# Patient Record
Sex: Male | Born: 1990 | Race: Black or African American | Hispanic: No | Marital: Single | Smoking: Current every day smoker
Health system: Southern US, Community
[De-identification: ages and names within clinical notes are randomized; demographics above are authoritative.]

## PROBLEM LIST (undated history)

## (undated) DIAGNOSIS — J45909 Unspecified asthma, uncomplicated: Secondary | ICD-10-CM

---

## 2014-05-15 ENCOUNTER — Emergency Department (HOSPITAL_COMMUNITY)
Admission: EM | Admit: 2014-05-15 | Discharge: 2014-05-15 | Disposition: A | Discharge status: Home / Self Care | Payer: Self-pay | Attending: Emergency Medicine | Admitting: Emergency Medicine

## 2014-05-15 ENCOUNTER — Encounter (HOSPITAL_COMMUNITY): Payer: Self-pay | Admitting: Emergency Medicine

## 2014-05-15 ENCOUNTER — Emergency Department (HOSPITAL_COMMUNITY): Payer: Self-pay

## 2014-05-15 DIAGNOSIS — Z720 Tobacco use: Secondary | ICD-10-CM | POA: Insufficient documentation

## 2014-05-15 DIAGNOSIS — R05 Cough: Secondary | ICD-10-CM

## 2014-05-15 DIAGNOSIS — B349 Viral infection, unspecified: Secondary | ICD-10-CM | POA: Insufficient documentation

## 2014-05-15 DIAGNOSIS — R059 Cough, unspecified: Secondary | ICD-10-CM

## 2014-05-15 DIAGNOSIS — J40 Bronchitis, not specified as acute or chronic: Secondary | ICD-10-CM | POA: Insufficient documentation

## 2014-05-15 MED ORDER — ALBUTEROL SULFATE HFA 108 (90 BASE) MCG/ACT IN AERS
1.0000 | INHALATION_SPRAY | Freq: Four times a day (QID) | RESPIRATORY_TRACT | Status: DC | PRN
  Administered 2014-05-15: 1 via RESPIRATORY_TRACT
  Filled 2014-05-15: qty 6.7

## 2014-05-15 MED ORDER — ALBUTEROL SULFATE HFA 108 (90 BASE) MCG/ACT IN AERS: 2.0000 | INHALATION_SPRAY | Freq: Four times a day (QID) | RESPIRATORY_TRACT | Status: DC | PRN

## 2014-05-15 NOTE — ED Notes (Signed)
Pt c/o cough and nasal congestion x 3 days  °

## 2014-05-15 NOTE — ED Provider Notes (Signed)
CSN: [id number]     Arrival date & time 05/15/14  1405 History   First MD Initiated Contact with Patient 05/15/14 1551     Chief Complaint  Patient presents with  . Cough  . Nasal Congestion     (Consider location/radiation/quality/duration/timing/severity/associated sxs/prior Treatment) The history is provided by the patient. No language interpreter was used.  Christopher Gentry is a 24 year old male with no known significant past medical history presenting to the ED with chest congestion and cough that has been ongoing for approximately 2-3 days. Patient reports that he has been experiencing nasal congestion, productive cough and chest congestion reported that when he coughs she is coughing up a thick white yellow phlegm experiences burning after coughing. Stated that he coughed up specks of bright red blood mixed with the phlegm. Stated that he has history of bronchitis and normally gets bright red blood specks in his phlegm when he coughs them up. Stated he's been using Delsym and Mucinex without relief. Reported that patient's girlfriend had similar symptoms. Reports she smokes cigarettes at least 5 cigarettes per day as well as marijuana. Denied chest pain, shortness of breath, difficulty breathing, blurred vision, sudden loss of vision, neck pain, neck stiffness, nausea, vomiting, abdominal pain, headache, dizziness, ear pain, sore throat, eye pain, travel, leg swelling. PCP none  History reviewed. No pertinent past medical history. History reviewed. No pertinent past surgical history. History reviewed. No pertinent family history. History  Substance Use Topics  . Smoking status: Current Every Day Smoker  . Smokeless tobacco: Not on file  . Alcohol Use: Yes    Review of Systems  Constitutional: Negative for fever and chills.  HENT: Positive for congestion. Negative for sore throat and trouble swallowing.   Eyes: Negative for visual disturbance.  Respiratory: Positive for cough. Negative  for chest tightness and shortness of breath.   Cardiovascular: Negative for chest pain.  Gastrointestinal: Negative for nausea, vomiting and abdominal pain.  Musculoskeletal: Negative for neck pain and neck stiffness.  Neurological: Negative for dizziness, weakness and headaches.      Allergies  Review of patient's allergies indicates no known allergies.  Home Medications   Prior to Admission medications   Not on File   BP 131/81 mmHg  Pulse 73  Temp(Src) 98 F (36.7 C) (Oral)  Resp 18  SpO2 100% Physical Exam  Constitutional: He is oriented to person, place, and time. He appears well-developed and well-nourished. No distress.  HENT:  Head: Normocephalic and atraumatic.  Right Ear: Hearing, tympanic membrane, external ear and ear canal normal. No mastoid tenderness.  Left Ear: Hearing, tympanic membrane, external ear and ear canal normal. No mastoid tenderness.  Mouth/Throat: Oropharynx is clear and moist. No oropharyngeal exudate.  Eyes: Conjunctivae and EOM are normal. Pupils are equal, round, and reactive to light. Right eye exhibits no discharge. Left eye exhibits no discharge.  Neck: Normal range of motion. Neck supple. No tracheal deviation present.  Negative neck stiffness Negative nuchal rigidity Negative cervical lymphadenopathy Negative meningeal signs  Cardiovascular: Normal rate, regular rhythm and normal heart sounds.  Exam reveals no friction rub.   No murmur heard. Pulmonary/Chest: Effort normal and breath sounds normal. No respiratory distress. He has no wheezes. He has no rales.  Patient is able to speak in full sentences without difficulty Negative use of accessory muscles Negative stridor  Musculoskeletal: Normal range of motion.  Lymphadenopathy:    He has no cervical adenopathy.  Neurological: He is alert and oriented to person, place, and  time. No cranial nerve deficit. He exhibits normal muscle tone. Coordination normal.  Skin: Skin is warm and  dry. No rash noted. He is not diaphoretic. No erythema.  Psychiatric: He has a normal mood and affect. His behavior is normal. Thought content normal.  Nursing note and vitals reviewed.   ED Course  Procedures (including critical care time) Labs Review Labs Reviewed - No data to display  Imaging Review Dg Chest 2 View  05/15/2014   CLINICAL DATA:  Pt c/o cough and congestion x 3 days. Unable to cough up phlegm. Hx of bronchitis  EXAM: CHEST  2 VIEW  COMPARISON:  None.  FINDINGS: The heart size and mediastinal contours are within normal limits. Both lungs are clear. No pleural effusion or pneumothorax. The visualized skeletal structures are unremarkable.  IMPRESSION: Normal chest radiograph.   Electronically Signed   By: Amie Portlandavid  Ormond M.D.   On: 05/15/2014 16:36     EKG Interpretation None      MDM   Final diagnoses:  Bronchitis  Viral syndrome  Cough    Medications  albuterol (PROVENTIL HFA;VENTOLIN HFA) 108 (90 BASE) MCG/ACT inhaler 1 puff (not administered)    Filed Vitals:   05/15/14 1425 05/15/14 1716  BP: 136/79 131/81  Pulse: 82 73  Temp: 98.9 F (37.2 C) 98 F (36.7 C)  TempSrc: Oral Oral  Resp: 18 18  SpO2: 96% 100%   Chest x-ray normal. Negative findings of pneumonia noted on chest x-ray. Doubt PE-negative chest pain, negative tachycardia, negative tachypnea, negative hypoxia noted on examination. PERC low. Suspicion to be bronchitis, patient's sister the same in the past. Patient stable, afebrile. Patient not septic appearing. Negative signs of respiratory distress. Discharged patient. Discharge patient with albuterol inhaler to be used as needed and see pack. Discussed with patient to rest and stay hydrated. Discussed with patient to avoid smoking cigarettes as well as smoking cessation. [facility] Discussed with patient to closely monitor symptoms and if symptoms are to worsen or change to report back to the ED - strict  return instructions given.  Patient agreed to plan of care, understood, all questions answered.   Raymon MuttonMarissa Taaliyah Delpriore, PA-C 05/15/14 1718  Samuel JesterKathleen McManus, DO 05/17/14 2149

## 2014-05-15 NOTE — Discharge Instructions (Signed)
Please call your doctor for a followup appointment within 24-48 hours. When you talk to your doctor please let them know that you were seen in the emergency department and have them acquire all of your records so that they can discuss the findings with you and formulate a treatment plan to fully care for your new and ongoing problems. Please follow-up with health and wellness Center Please rest and stay hydrated Please avoid any physical strenuous activity Please use albuterol for shortness of breath or wheezing every 6 hours as needed Please avoid smoking for smoking can lead to worsening of symptoms as well as can lead to lung cancer contact him be fatal later down the road Please continue to monitor symptoms closely and if symptoms are to worsen or change (fever greater than 101, chills, sweating, nausea, vomiting, chest pain, shortness of breathe, difficulty breathing, weakness, numbness, tingling, worsening or changes to pain pattern, coughing up nothing but blood, neck pain, neck stiffness, dizziness, headache, loss of sensation, visual changes) please report back to the Emergency Department immediately.   Acute Bronchitis Bronchitis is inflammation of the airways that extend from the windpipe into the lungs (bronchi). The inflammation often causes mucus to develop. This leads to a cough, which is the most common symptom of bronchitis.  In acute bronchitis, the condition usually develops suddenly and goes away over time, usually in a couple weeks. Smoking, allergies, and asthma can make bronchitis worse. Repeated episodes of bronchitis may cause further lung problems.  CAUSES Acute bronchitis is most often caused by the same virus that causes a cold. The virus can spread from person to person (contagious) through coughing, sneezing, and touching contaminated objects. SIGNS AND SYMPTOMS   Cough.   Fever.   Coughing up mucus.   Body aches.   Chest congestion.   Chills.    Shortness of breath.   Sore throat.  DIAGNOSIS  Acute bronchitis is usually diagnosed through a physical exam. Your health care provider will also ask you questions about your medical history. Tests, such as chest X-rays, are sometimes done to rule out other conditions.  TREATMENT  Acute bronchitis usually goes away in a couple weeks. Oftentimes, no medical treatment is necessary. Medicines are sometimes given for relief of fever or cough. Antibiotic medicines are usually not needed but may be prescribed in certain situations. In some cases, an inhaler may be recommended to help reduce shortness of breath and control the cough. A cool mist vaporizer may also be used to help thin bronchial secretions and make it easier to clear the chest.  HOME CARE INSTRUCTIONS  Get plenty of rest.   Drink enough fluids to keep your urine clear or pale yellow (unless you have a medical condition that requires fluid restriction). Increasing fluids may help thin your respiratory secretions (sputum) and reduce chest congestion, and it will prevent dehydration.   Take medicines only as directed by your health care provider.  If you were prescribed an antibiotic medicine, finish it all even if you start to feel better.  Avoid smoking and secondhand smoke. Exposure to cigarette smoke or irritating chemicals will make bronchitis worse. If you are a smoker, consider using nicotine gum or skin patches to help control withdrawal symptoms. Quitting smoking will help your lungs heal faster.   Reduce the chances of another bout of acute bronchitis by washing your hands frequently, avoiding people with cold symptoms, and trying not to touch your hands to your mouth, nose, or eyes.   Keep  all follow-up visits as directed by your health care provider.  SEEK MEDICAL CARE IF: Your symptoms do not improve after 1 week of treatment.  SEEK IMMEDIATE MEDICAL CARE IF:  You develop an increased fever or chills.    You have chest pain.   You have severe shortness of breath.  You have bloody sputum.   You develop dehydration.  You faint or repeatedly feel like you are going to pass out.  You develop repeated vomiting.  You develop a severe headache. MAKE SURE YOU:   Understand these instructions.  Will watch your condition.  Will get help right away if you are not doing well or get worse. Document Released: 02/12/2004 Document Revised: 05/21/2013 Document Reviewed: 06/27/2012 [facility] Patient Information 2015 [place], [place]. This information is not intended to replace advice given to you by your health care provider. Make sure you discuss any questions you have with your health care provider.   Emergency Department Resource Guide 1) Find a Doctor and Pay Out of Pocket Although you won't have to find out who is covered by your insurance plan, it is a good idea to ask around and get recommendations. You will then need to call the office and see if the doctor you have chosen will accept you as a new patient and what types of options they offer for patients who are self-pay. Some doctors offer discounts or will set up payment plans for their patients who do not have insurance, but you will need to ask so you aren't surprised when you get to your appointment.  2) Contact Your Local Health Department Not all health departments have doctors that can see patients for sick visits, but many do, so it is worth a call to see if yours does. If you don't know where your local health department is, you can check in your phone book. The CDC also has a tool to help you locate your state's health department, and many state websites also have listings of all of their local health departments.  3) Find a Walk-in Clinic If your illness is not likely to be very severe or complicated, you may want to try a walk in clinic. These are popping up all over the country in pharmacies, drugstores, and shopping centers.  They're usually staffed by nurse practitioners or physician assistants that have been trained to treat common illnesses and complaints. They're usually fairly quick and inexpensive. However, if you have serious medical issues or chronic medical problems, these are probably not your best option.  No Primary Care Doctor: - Call Health Connect at  [phone] - they can help you locate a primary care doctor that  accepts your insurance, provides certain services, etc. - Physician Referral Service- [phone]  Chronic Pain Problems: Organization         Address  Phone   Notes  [facility]  [phone] Patients need to be referred by their primary care doctor.   Medication Assistance: Organization         Address  Phone   Notes  [facility] [address] [place], [place] [address] [phone] --Must be a resident of [facility] -- Must have NO insurance coverage whatsoever (no Medicaid/ Medicare, etc.) -- The pt. MUST have a primary care doctor that directs their care regularly and follows them in the community   [facility]  [phone]   [employer]  [phone]    Agencies that provide  inexpensive medical care: Organization         Address  Phone   Notes  [facility]  [phone]   [facility]    [phone]   [facility] [address] [place], [place] [id number] [phone]) [phone]   [facility] [place] [address], [place] [phone]) [phone]   Planned Parenthood    [phone]   [facility]    [phone]   [facility]  [address], [place] Phone:  [phone], Fax:  [phone]) [phone] Hours of Operation:  9 am - 6 pm, M-F.  Also accepts Medicaid/Medicare and self-pay.  [facility]  [address], [place] Phone: [phone], Fax: [phone])  [phone]. Hours of Operation:  8:30 am - 5:30 pm, M-F.  Also accepts Medicaid and self-pay.  [facility] [place] [address], [place] Phone: [phone]) [phone]   [facility] [address], [place], [place] [phone], Ext. [phone] Mondays & Thursdays: 7-9 AM.  First 15 patients are seen on a first come, first serve basis.    Medicaid-accepting [facility] Providers:  Organization         Address  Phone   Notes  [facility] [address], [place] [phone] Also accepts self-pay patients.  [facility] [address], [place]  [phone]) [phone]   [facility] [address], [place] [phone]) [phone]   [facility] [address] [place] [address], [place] [phone]) [phone]   Renaye RakersVeita Bland [address], [place]   [phone] Only accepts [place] Access [place] patients after they have their name applied to their card.   Self-Pay (no insurance) in [facility]:  Organization         Address  Phone   Notes  Sickle Cell Patients, [facility] [address], [place] [phone]) [phone]   [facility] [address], [place] [phone]) [phone]   [facility] [place]  [address], [place] [phone]   [facility]/Dr. Osei-Bonsu  [address] [place] [address], [place] or [phone], [place] [phone] Phone number for both [place] and [place] locations is the same.  [facility] [address], [place] [phone]   [facility] [place] [address] [place] [address], [place] or [address] [phone]   [facility] [address], [place] [phone](336) [phone], phone; [phone]) [phone], fax Sees patients 1st and 3rd Saturday of every month.  Must not qualify for public or private insurance (i.e.  Medicaid, Medicare, [place] Health Choice, Veterans' Benefits)  Household income should be no more than 200% of the poverty level The clinic cannot treat you if you are pregnant or think you are pregnant  Sexually transmitted diseases are not treated at the clinic.    Dental Care: Organization         Address  Phone  Notes  [facility] of [facility] [address], [place] [phone]) [phone] Accepts children up to age 24 who are enrolled in [place] or [place] Health Choice; pregnant women with a Medicaid card; and children who have applied for Medicaid or [place] Health Choice,  but were declined, whose parents can pay a reduced fee at time of service.  [facility] of [facility]  [address], [place] [phone] Accepts children up to age 5 who are enrolled in [place] or [place] Health Choice; pregnant women with a Medicaid card; and children who have applied for Medicaid or [place] Health Choice, but were declined, whose parents can pay a reduced fee at time of service.  [facility]  [address] [place], [place] [phone] Patients are seen by appointment only. Walk-ins are not accepted. [facility] will see patients 19 years of age and older. Monday - Tuesday (8am-5pm) Most Wednesdays (8:30-5pm) $30 per visit, cash only  [facility] Adult Dental Access PROGRAM  [address], [facility] [phone] Patients are seen by appointment only. Walk-ins are not accepted. [facility] will see patients 49 years of age and older. One Wednesday Evening (Monthly: Volunteer Based).  $30 per visit, cash only  [employer]  [phone] for adults; Children under age 71, call Graduate Pediatric Dentistry at [phone]. Children aged 39-14, please call [phone] to request a pediatric application.  Dental services are provided in all areas of dental care including fillings,  crowns and bridges, complete and partial dentures, implants, gum treatment, root canals, and extractions. Preventive care is also provided. Treatment is provided to both adults and children. Patients are selected via a lottery and there is often a waiting list.   [facility] [address] [place]  [phone] [email or web address]   [facility] [address] [place], [place] [phone], Ext. [phone] Second and Fourth Thursday of each month, opens at 6:30 AM; Clinic ends at 9 AM.  Patients are seen on a first-come first-served basis, and a limited number are seen during each clinic.   [facility]  [address] [place], [place] [phone]   Eligibility Requirements You must have lived in [place], [place], or [place] counties for at least the last three months.   You cannot be eligible for state or federal sponsored [place], including [employer], [place], or [employer].   You generally cannot be eligible for healthcare insurance through your employer.    How to apply: Eligibility screenings are held every Tuesday and Wednesday afternoon from 1:00 pm until 4:00 pm. You do not need an appointment for the interview!  [facility] [address], [place], [place] [phone]   [facility] Health Department  [phone]   [facility]  [phone]   [facility]  [phone]    Behavioral Health Resources in the Community: Intensive Outpatient Programs Organization         Address  Phone  Notes  [facility] [address], [place], [place] [phone]   [facility] Outpatient [address] [place], [place] [phone]   [facility] [address] [place], [place]  [phone]   [facility] [address],  [place], [place] [phone] or [phone]   Substance Abuse  Resources Organization         Address  Phone  Notes  [facility] Drug Services  [phone]   [facility]  4348559662   [facility] [place]  [phone]   Floydene Flock  [phone]   Residential & Outpatient Substance Abuse Program  [phone]   Psychological  Passenger transport manager         Address  Phone  Notes  [employer] [facility]  [phone]   [place] [facility]  [phone]   [facility] [phone] N[address] [place] [phone] or [phone]    Mobile Crisis Teams Organization         Address  Phone  Notes  [facility], Mobile Crisis Care Unit  [phone]   [facility]  [address] [place], [place] [phone]   Doristine Locks [address] [place] [phone]    Self-Help/Support Groups Organization         Address  Phone             Notes  [facility] [place] - variety of support groups  336- I7437963 Call for more information  Narcotics Anonymous (NA), [facility] [address], [employer] [place]  2 meetings at this location   Statistician         Address  Phone  Notes  [facility] [address],    [place]  [phone]   [facility]  [address] [place] [address], [place], [place] [phone]   [facility] [address] [place], [place] [phone] Admissions: 8am-3pm M-F  [facility] [address]    [place], [place] [phone]   [facility] [address] [place], [place], [place] [phone]   [facility] [address]  [place], [place] [phone]   [facility] - Intensive Outpatient [address], [place], [place] [phone]   [facility] ([facility] [address]  [place], [place] [phone] or [phone]   [facility] ([facility]) [address] [place], [place] [phone] Accepts Medicaid  [facility] [address],  [place] [phone] Substance Abuse/Addiction Treatment   [facility] Organization         Address  Phone  Notes  [facility]  [phone]   Angie Fava, PhD [address] [place], [place]   [phone] or [phone]   [facility]   [address] [place], [place] [phone]   [facility] [address] [place], [place] [phone] Insurance/Medicaid/sponsorship through [facility] and Families [address]                                    [place], [place] [phone] Therapy/tele-psych/case  [facility] [address], [place] [phone]    Dr. Lolly Mustache  [phone]   [facility]  [employer] [facility] Dept. 1) [address]  2) [address] [place] 3)  [address], [place] [phone]  [phone]   [facility] Child Abuse Hotline 713-483-8211 or [phone] (After Hours)

## 2014-05-15 NOTE — ED Notes (Signed)
Pt c/o cough and congestion x 3 days. Unable to cough up phlegm. Hx of bronchitis

## 2014-06-05 ENCOUNTER — Emergency Department (HOSPITAL_COMMUNITY): Payer: Self-pay

## 2014-06-05 ENCOUNTER — Emergency Department (HOSPITAL_COMMUNITY)
Admission: EM | Admit: 2014-06-05 | Discharge: 2014-06-05 | Disposition: A | Discharge status: Home / Self Care | Payer: Self-pay | Attending: Emergency Medicine | Admitting: Emergency Medicine

## 2014-06-05 ENCOUNTER — Encounter (HOSPITAL_COMMUNITY): Payer: Self-pay | Admitting: Emergency Medicine

## 2014-06-05 DIAGNOSIS — S0101XA Laceration without foreign body of scalp, initial encounter: Secondary | ICD-10-CM | POA: Insufficient documentation

## 2014-06-05 DIAGNOSIS — S70212A Abrasion, left hip, initial encounter: Secondary | ICD-10-CM | POA: Insufficient documentation

## 2014-06-05 DIAGNOSIS — Z23 Encounter for immunization: Secondary | ICD-10-CM | POA: Insufficient documentation

## 2014-06-05 DIAGNOSIS — Z720 Tobacco use: Secondary | ICD-10-CM | POA: Insufficient documentation

## 2014-06-05 DIAGNOSIS — M25551 Pain in right hip: Secondary | ICD-10-CM

## 2014-06-05 DIAGNOSIS — R42 Dizziness and giddiness: Secondary | ICD-10-CM | POA: Insufficient documentation

## 2014-06-05 DIAGNOSIS — S70211A Abrasion, right hip, initial encounter: Secondary | ICD-10-CM | POA: Insufficient documentation

## 2014-06-05 DIAGNOSIS — S29001A Unspecified injury of muscle and tendon of front wall of thorax, initial encounter: Secondary | ICD-10-CM | POA: Insufficient documentation

## 2014-06-05 DIAGNOSIS — M25552 Pain in left hip: Secondary | ICD-10-CM

## 2014-06-05 DIAGNOSIS — S0990XA Unspecified injury of head, initial encounter: Secondary | ICD-10-CM

## 2014-06-05 DIAGNOSIS — Y939 Activity, unspecified: Secondary | ICD-10-CM | POA: Insufficient documentation

## 2014-06-05 DIAGNOSIS — Y999 Unspecified external cause status: Secondary | ICD-10-CM | POA: Insufficient documentation

## 2014-06-05 DIAGNOSIS — Y929 Unspecified place or not applicable: Secondary | ICD-10-CM | POA: Insufficient documentation

## 2014-06-05 DIAGNOSIS — J45901 Unspecified asthma with (acute) exacerbation: Secondary | ICD-10-CM | POA: Insufficient documentation

## 2014-06-05 HISTORY — DX: Unspecified asthma, uncomplicated: J45.909

## 2014-06-05 LAB — URINALYSIS, ROUTINE W REFLEX MICROSCOPIC
BILIRUBIN URINE: NEGATIVE
GLUCOSE, UA: NEGATIVE mg/dL
HGB URINE DIPSTICK: NEGATIVE
KETONES UR: NEGATIVE mg/dL
Leukocytes, UA: NEGATIVE
Nitrite: NEGATIVE
Protein, ur: NEGATIVE mg/dL
Specific Gravity, Urine: 1.007 (ref 1.005–1.030)
Urobilinogen, UA: 1 mg/dL (ref 0.0–1.0)
pH: 7 (ref 5.0–8.0)

## 2014-06-05 LAB — CBC
HEMATOCRIT: 43.8 % (ref 39.0–52.0)
Hemoglobin: 14.9 g/dL (ref 13.0–17.0)
MCH: 30.3 pg (ref 26.0–34.0)
MCHC: 34 g/dL (ref 30.0–36.0)
MCV: 89 fL (ref 78.0–100.0)
Platelets: 273 10*3/uL (ref 150–400)
RBC: 4.92 MIL/uL (ref 4.22–5.81)
RDW: 13 % (ref 11.5–15.5)
WBC: 11.7 10*3/uL — AB (ref 4.0–10.5)

## 2014-06-05 LAB — I-STAT CHEM 8, ED
BUN: 15 mg/dL (ref 6–20)
CALCIUM ION: 1.14 mmol/L (ref 1.12–1.23)
CHLORIDE: 105 mmol/L (ref 101–111)
Creatinine, Ser: 1 mg/dL (ref 0.61–1.24)
GLUCOSE: 101 mg/dL — AB (ref 65–99)
HCT: 47 % (ref 39.0–52.0)
Hemoglobin: 16 g/dL (ref 13.0–17.0)
POTASSIUM: 3.8 mmol/L (ref 3.5–5.1)
Sodium: 140 mmol/L (ref 135–145)
TCO2: 21 mmol/L (ref 0–100)

## 2014-06-05 MED ORDER — TETANUS-DIPHTH-ACELL PERTUSSIS 5-2.5-18.5 LF-MCG/0.5 IM SUSP
0.5000 mL | Freq: Once | INTRAMUSCULAR | Status: AC
  Administered 2014-06-05: 0.5 mL via INTRAMUSCULAR
  Filled 2014-06-05: qty 0.5

## 2014-06-05 MED ORDER — SODIUM CHLORIDE 0.9 % IV BOLUS (SEPSIS)
1000.0000 mL | INTRAVENOUS | Status: AC
  Administered 2014-06-05: 1000 mL via INTRAVENOUS

## 2014-06-05 MED ORDER — ACETAMINOPHEN 325 MG PO TABS
650.0000 mg | ORAL_TABLET | Freq: Once | ORAL | Status: AC
  Administered 2014-06-05: 650 mg via ORAL
  Filled 2014-06-05: qty 2

## 2014-06-05 MED ORDER — HYDROMORPHONE HCL 1 MG/ML IJ SOLN
0.5000 mg | Freq: Once | INTRAMUSCULAR | Status: AC
  Administered 2014-06-05: 0.5 mg via INTRAVENOUS
  Filled 2014-06-05: qty 1
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
## 2014-06-05 NOTE — ED Notes (Signed)
Pt complaining of right hip pain
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
## 2014-08-16 ENCOUNTER — Emergency Department (HOSPITAL_COMMUNITY): Payer: Self-pay

## 2014-08-16 ENCOUNTER — Encounter (HOSPITAL_COMMUNITY): Payer: Self-pay | Admitting: General Practice

## 2014-08-16 ENCOUNTER — Emergency Department (HOSPITAL_COMMUNITY)
Admission: EM | Admit: 2014-08-16 | Discharge: 2014-08-16 | Disposition: A | Discharge status: Home / Self Care | Payer: Self-pay | Attending: Emergency Medicine | Admitting: Emergency Medicine

## 2014-08-16 DIAGNOSIS — Z720 Tobacco use: Secondary | ICD-10-CM | POA: Insufficient documentation

## 2014-08-16 DIAGNOSIS — S00412A Abrasion of left ear, initial encounter: Secondary | ICD-10-CM | POA: Insufficient documentation

## 2014-08-16 DIAGNOSIS — S41112A Laceration without foreign body of left upper arm, initial encounter: Secondary | ICD-10-CM | POA: Insufficient documentation

## 2014-08-16 DIAGNOSIS — Y92410 Unspecified street and highway as the place of occurrence of the external cause: Secondary | ICD-10-CM | POA: Insufficient documentation

## 2014-08-16 DIAGNOSIS — S41152A Open bite of left upper arm, initial encounter: Secondary | ICD-10-CM

## 2014-08-16 DIAGNOSIS — Y998 Other external cause status: Secondary | ICD-10-CM | POA: Insufficient documentation

## 2014-08-16 DIAGNOSIS — Y9389 Activity, other specified: Secondary | ICD-10-CM | POA: Insufficient documentation

## 2014-08-16 DIAGNOSIS — W540XXA Bitten by dog, initial encounter: Secondary | ICD-10-CM

## 2014-08-16 DIAGNOSIS — J45909 Unspecified asthma, uncomplicated: Secondary | ICD-10-CM | POA: Insufficient documentation

## 2014-08-16 MED ORDER — IBUPROFEN 600 MG PO TABS: 600.0000 mg | ORAL_TABLET | Freq: Four times a day (QID) | ORAL | Status: AC | PRN

## 2014-08-16 MED ORDER — AMOXICILLIN-POT CLAVULANATE 875-125 MG PO TABS: 1.0000 | ORAL_TABLET | Freq: Two times a day (BID) | ORAL | Status: AC

## 2014-08-16 MED ORDER — MORPHINE SULFATE 2 MG/ML IJ SOLN: 2.0000 mg | Freq: Once | INTRAMUSCULAR | Status: DC

## 2014-08-16 MED ORDER — AMOXICILLIN-POT CLAVULANATE 875-125 MG PO TABS
1.0000 | ORAL_TABLET | Freq: Once | ORAL | Status: AC
  Administered 2014-08-16: 1 via ORAL
  Filled 2014-08-16: qty 1
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
## 2014-08-16 NOTE — Discharge Instructions (Signed)
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
## 2022-07-28 DIAGNOSIS — Z113 Encounter for screening for infections with a predominantly sexual mode of transmission: Secondary | ICD-10-CM | POA: Diagnosis not present

## 2022-07-28 DIAGNOSIS — Z7289 Other problems related to lifestyle: Secondary | ICD-10-CM | POA: Diagnosis not present

## 2022-07-28 DIAGNOSIS — M25521 Pain in right elbow: Secondary | ICD-10-CM | POA: Diagnosis not present

## 2022-07-28 DIAGNOSIS — R202 Paresthesia of skin: Secondary | ICD-10-CM | POA: Diagnosis not present

## 2022-07-28 DIAGNOSIS — F17210 Nicotine dependence, cigarettes, uncomplicated: Secondary | ICD-10-CM | POA: Diagnosis not present

## 2022-07-28 DIAGNOSIS — G8911 Acute pain due to trauma: Secondary | ICD-10-CM | POA: Diagnosis not present
